# Patient Record
Sex: Female | Born: 1965
Health system: Southern US, Community
[De-identification: ages and names within clinical notes are randomized; demographics above are authoritative.]

## PROBLEM LIST (undated history)

## (undated) DIAGNOSIS — N028 Recurrent and persistent hematuria with other morphologic changes: Secondary | ICD-10-CM

## (undated) DIAGNOSIS — E785 Hyperlipidemia, unspecified: Secondary | ICD-10-CM

## (undated) DIAGNOSIS — I10 Essential (primary) hypertension: Secondary | ICD-10-CM

## (undated) HISTORY — DX: Recurrent and persistent hematuria with other morphologic changes: N02.8

## (undated) HISTORY — DX: Essential (primary) hypertension: I10

## (undated) HISTORY — DX: Hyperlipidemia, unspecified: E78.5

---

## 1998-04-18 ENCOUNTER — Inpatient Hospital Stay (HOSPITAL_COMMUNITY): Admission: AD | Admit: 1998-04-18 | Discharge: 1998-04-20 | Payer: Self-pay | Admitting: *Deleted

## 1998-05-24 ENCOUNTER — Other Ambulatory Visit: Admission: RE | Admit: 1998-05-24 | Discharge: 1998-05-24 | Payer: Self-pay | Admitting: *Deleted

## 1998-10-18 ENCOUNTER — Observation Stay (HOSPITAL_COMMUNITY): Admission: AD | Admit: 1998-10-18 | Discharge: 1998-10-19 | Payer: Self-pay | Admitting: Nephrology

## 1998-10-18 ENCOUNTER — Encounter: Payer: Self-pay | Admitting: Nephrology

## 1998-10-18 DIAGNOSIS — N02B9 Other recurrent and persistent immunoglobulin A nephropathy: Secondary | ICD-10-CM

## 1998-10-18 DIAGNOSIS — N028 Recurrent and persistent hematuria with other morphologic changes: Secondary | ICD-10-CM

## 1998-10-18 HISTORY — DX: Recurrent and persistent hematuria with other morphologic changes: N02.8

## 1998-10-18 HISTORY — DX: Other recurrent and persistent immunoglobulin A nephropathy: N02.B9

## 2000-01-19 ENCOUNTER — Other Ambulatory Visit: Admission: RE | Admit: 2000-01-19 | Discharge: 2000-01-19 | Payer: Self-pay | Admitting: Obstetrics & Gynecology

## 2000-08-05 ENCOUNTER — Inpatient Hospital Stay (HOSPITAL_COMMUNITY): Admission: AD | Admit: 2000-08-05 | Discharge: 2000-08-07 | Payer: Self-pay | Admitting: *Deleted

## 2000-09-22 ENCOUNTER — Other Ambulatory Visit: Admission: RE | Admit: 2000-09-22 | Discharge: 2000-09-22 | Payer: Self-pay | Admitting: Obstetrics and Gynecology

## 2011-09-20 ENCOUNTER — Ambulatory Visit (INDEPENDENT_AMBULATORY_CARE_PROVIDER_SITE_OTHER): Payer: BC Managed Care – PPO | Admitting: Family Medicine

## 2011-09-20 ENCOUNTER — Ambulatory Visit: Payer: BC Managed Care – PPO

## 2011-09-20 VITALS — BP 130/88 | HR 80 | Temp 98.4°F | Resp 16 | Ht 62.0 in | Wt 127.8 lb

## 2011-09-20 DIAGNOSIS — R059 Cough, unspecified: Secondary | ICD-10-CM

## 2011-09-20 DIAGNOSIS — R05 Cough: Secondary | ICD-10-CM

## 2011-09-20 DIAGNOSIS — Z0000 Encounter for general adult medical examination without abnormal findings: Secondary | ICD-10-CM

## 2011-09-20 DIAGNOSIS — J45901 Unspecified asthma with (acute) exacerbation: Secondary | ICD-10-CM | POA: Insufficient documentation

## 2011-09-20 MED ORDER — PREDNISONE 20 MG PO TABS: 40.0000 mg | ORAL_TABLET | Freq: Every day | ORAL | Status: AC

## 2011-09-20 MED ORDER — MOMETASONE FURO-FORMOTEROL FUM 200-5 MCG/ACT IN AERO: 2.0000 | INHALATION_SPRAY | Freq: Two times a day (BID) | RESPIRATORY_TRACT | Status: AC

## 2011-09-20 NOTE — Patient Instructions (Signed)
Follow-up in two weeks

## 2011-09-20 NOTE — Progress Notes (Addendum)
This is a 46 year old [place] woman who comes in with a three-month history of cough. Cough is intermittent but occurs almost every day and sometimes violent with productive sputum or hemoptysis. Appetite is good she's had no weight loss. There is no history of tuberculosis. There is no family history of tuberculosis.  Objective: This is a [place] woman with a language barrier who was seen with her husband she is in no acute respiratory distress  She does have about. There is no neck adenopathy heart is regular there is no murmur. And HEENT is normal. Skin is dry and warm. She is neurologically intact. [facility] reading (PRIMARY) by  Dr. Milus Glazier:  No infiltrate.  hyperinflated  Assessment: Asthma, chronic persistent

## 2011-10-14 ENCOUNTER — Encounter: Payer: Self-pay | Admitting: Nephrology

## 2011-10-14 DIAGNOSIS — E785 Hyperlipidemia, unspecified: Secondary | ICD-10-CM

## 2011-10-14 DIAGNOSIS — I10 Essential (primary) hypertension: Secondary | ICD-10-CM

## 2011-10-14 HISTORY — DX: Essential (primary) hypertension: I10

## 2011-10-14 HISTORY — DX: Hyperlipidemia, unspecified: E78.5

## 2011-10-25 ENCOUNTER — Ambulatory Visit (INDEPENDENT_AMBULATORY_CARE_PROVIDER_SITE_OTHER): Payer: BC Managed Care – PPO | Admitting: Family Medicine

## 2011-10-25 DIAGNOSIS — J45902 Unspecified asthma with status asthmaticus: Secondary | ICD-10-CM

## 2011-10-25 MED ORDER — PREDNISONE 20 MG PO TABS: ORAL_TABLET | ORAL | Status: DC

## 2011-10-25 NOTE — Progress Notes (Signed)
46 year old Lorraine Hernandez comes in with asthma. She is accompanied by her husband. She did well with the Julliard and prednisone but the symptoms have come back once she finished the prednisone. She has cough and scratchy throat. Just has some congestion.  Objective: HEENT-unremarkable except for mildly injected eyes  Chest: Few faint wheezes  Heart: Regular  Assessment: Persistent asthma  Plan: Continue dulera 200 bid, prednisone taper

## 2013-01-30 IMAGING — CR DG CHEST 2V
2 series · 2 of 2 positions shown · non-contrast
Comparison: None.

CLINICAL DATA: Cough.

CHEST - 2 VIEW

[PA]
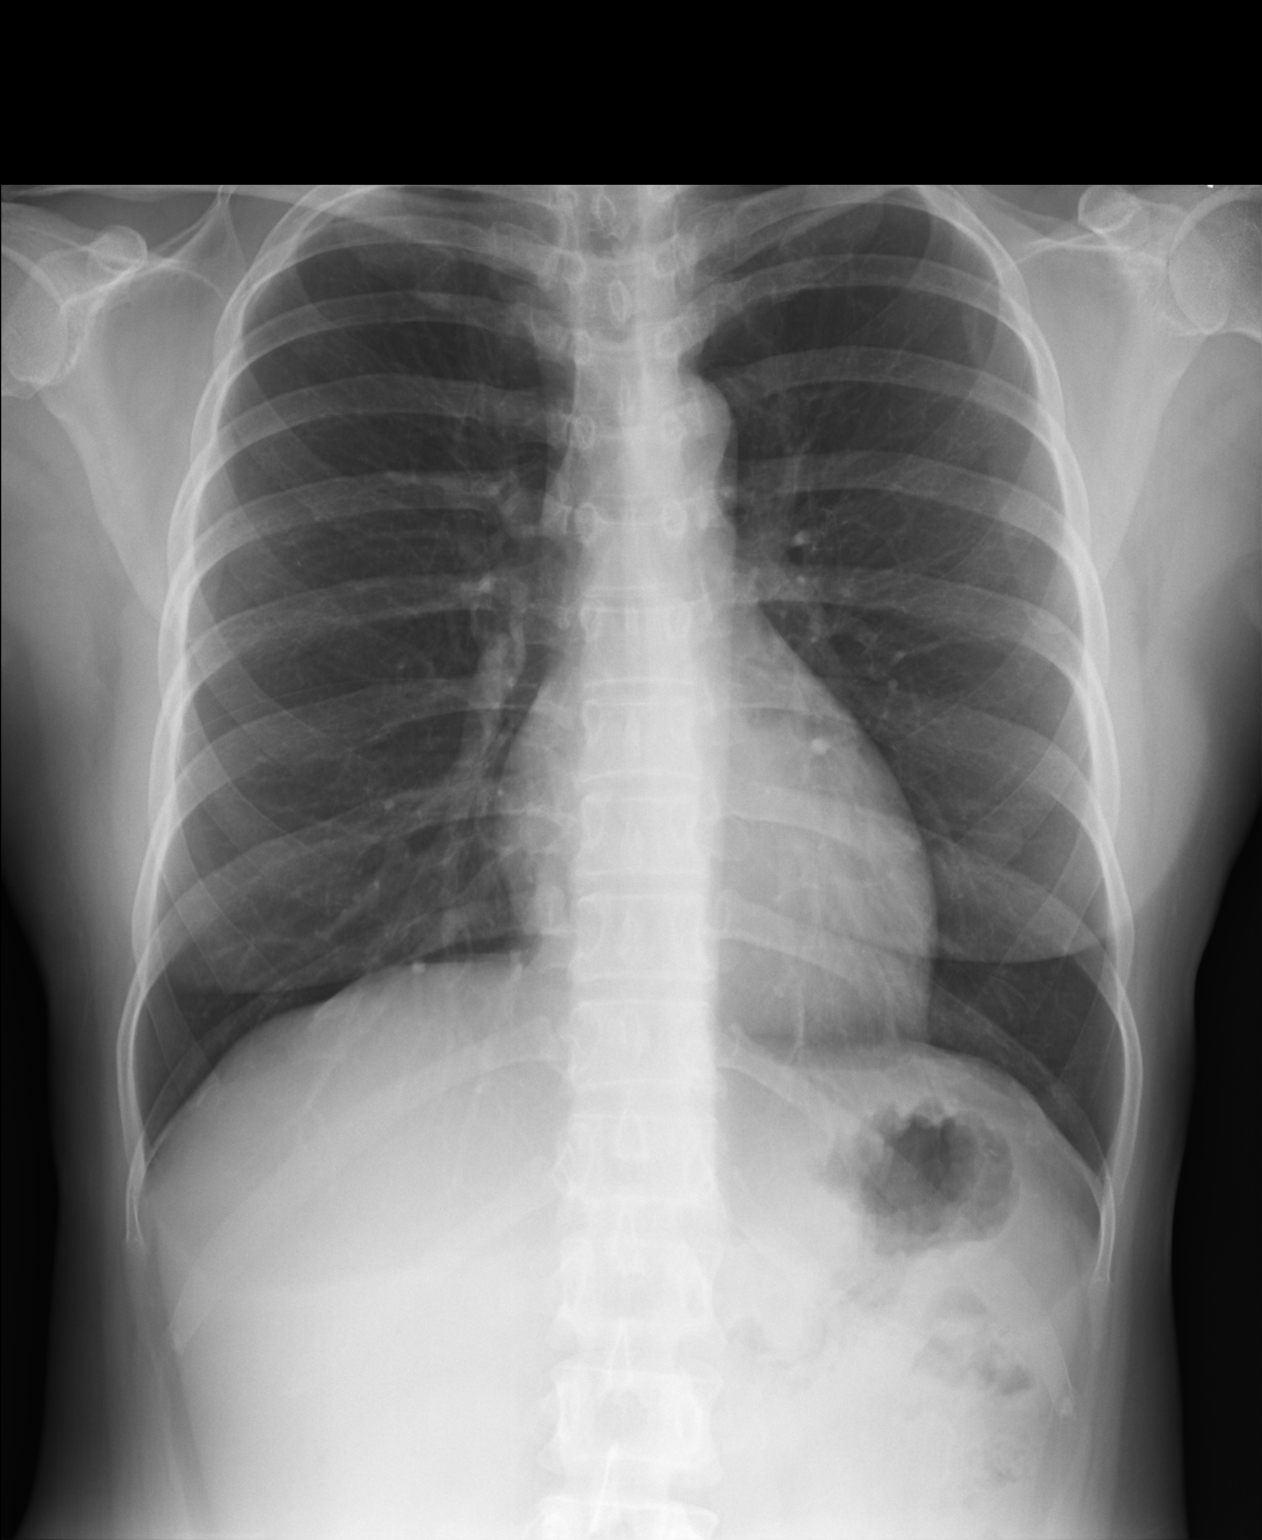

[lateral]
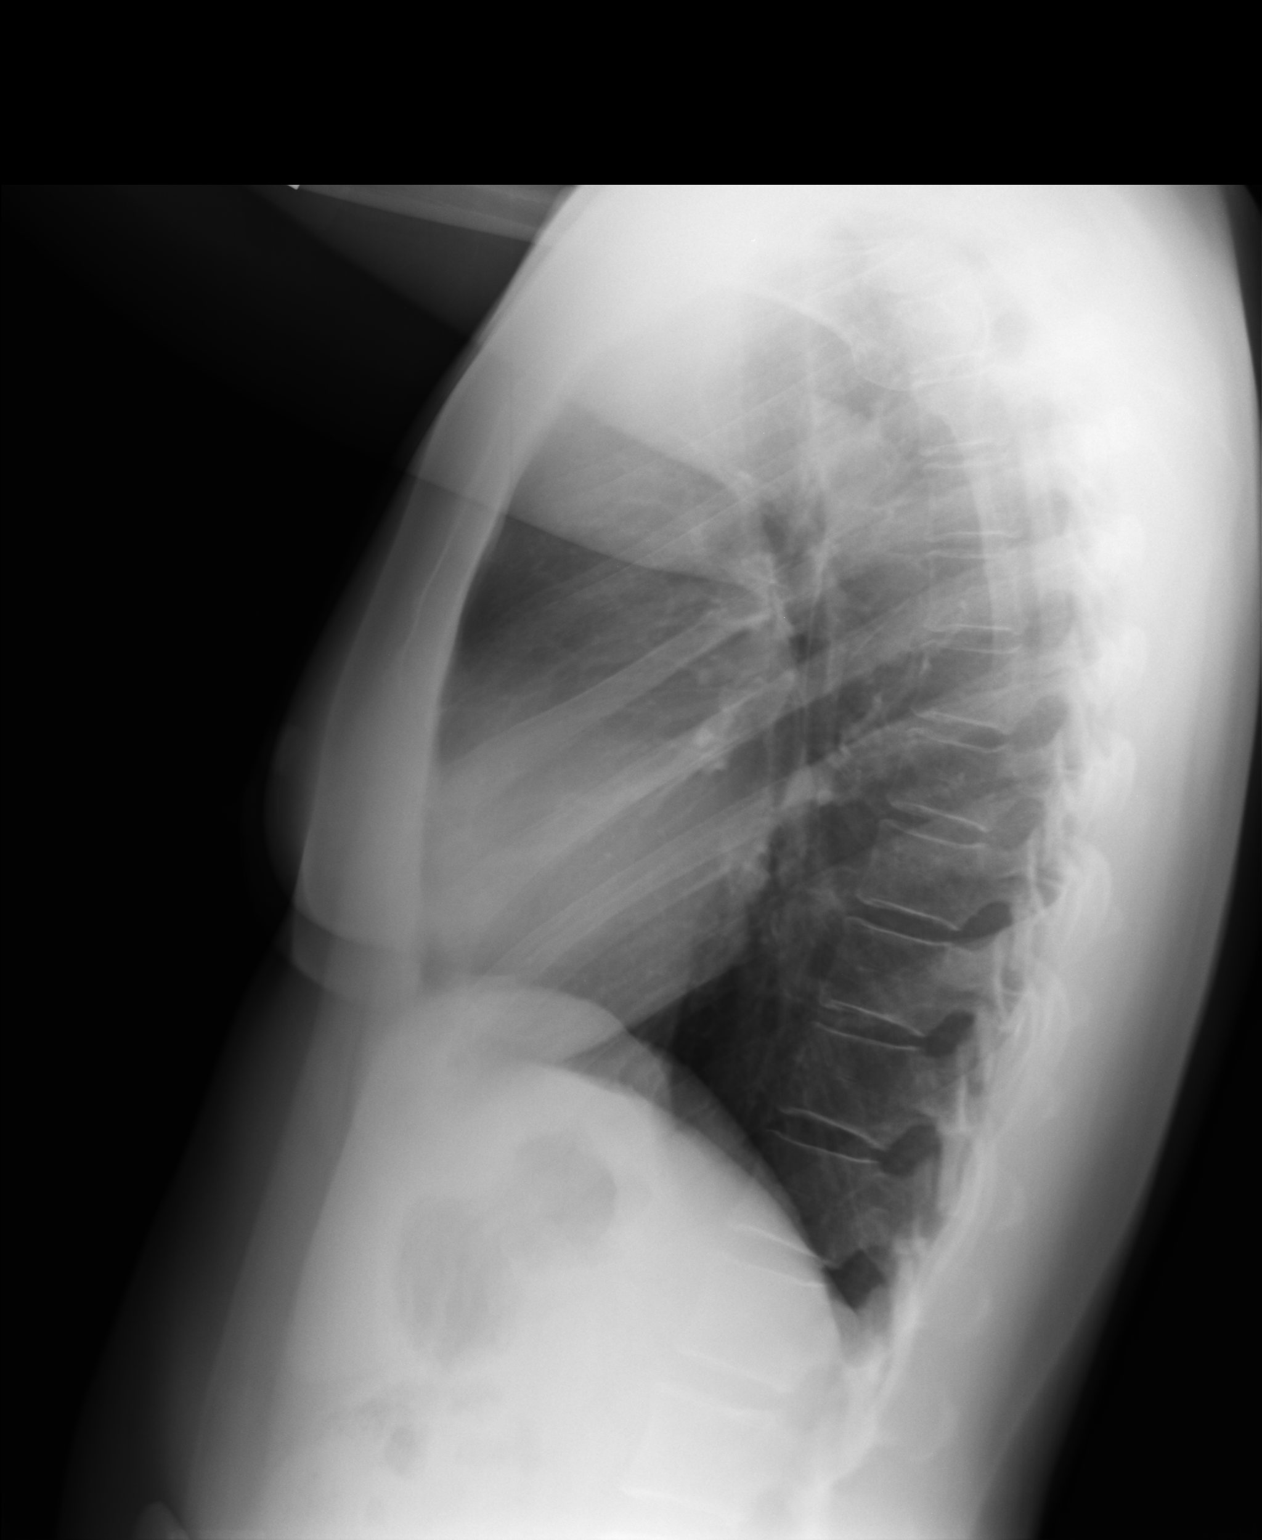

[2 of 2 positions shown; findings below may reference images not displayed]

FINDINGS: Cardiopericardial silhouette within normal limits.
Mediastinal contours normal. Trachea midline.  No airspace disease
or effusion.
IMPRESSION: No active cardiopulmonary disease.

## 2018-09-18 DIAGNOSIS — J40 Bronchitis, not specified as acute or chronic: Secondary | ICD-10-CM | POA: Diagnosis not present

## 2018-09-18 DIAGNOSIS — J0140 Acute pansinusitis, unspecified: Secondary | ICD-10-CM | POA: Diagnosis not present

## 2019-07-19 DIAGNOSIS — I10 Essential (primary) hypertension: Secondary | ICD-10-CM | POA: Diagnosis not present

## 2019-07-19 DIAGNOSIS — N028 Recurrent and persistent hematuria with other morphologic changes: Secondary | ICD-10-CM | POA: Diagnosis not present

## 2019-07-19 DIAGNOSIS — E785 Hyperlipidemia, unspecified: Secondary | ICD-10-CM | POA: Diagnosis not present

## 2019-07-28 DIAGNOSIS — E785 Hyperlipidemia, unspecified: Secondary | ICD-10-CM | POA: Diagnosis not present

## 2019-07-28 DIAGNOSIS — J309 Allergic rhinitis, unspecified: Secondary | ICD-10-CM | POA: Diagnosis not present

## 2019-07-28 DIAGNOSIS — N028 Recurrent and persistent hematuria with other morphologic changes: Secondary | ICD-10-CM | POA: Diagnosis not present

## 2019-07-28 DIAGNOSIS — I10 Essential (primary) hypertension: Secondary | ICD-10-CM | POA: Diagnosis not present

## 2019-07-31 DIAGNOSIS — Z20828 Contact with and (suspected) exposure to other viral communicable diseases: Secondary | ICD-10-CM | POA: Diagnosis not present

## 2019-08-19 DIAGNOSIS — Z03818 Encounter for observation for suspected exposure to other biological agents ruled out: Secondary | ICD-10-CM | POA: Diagnosis not present

## 2019-08-19 DIAGNOSIS — I10 Essential (primary) hypertension: Secondary | ICD-10-CM | POA: Diagnosis not present

## 2019-08-19 DIAGNOSIS — Z20828 Contact with and (suspected) exposure to other viral communicable diseases: Secondary | ICD-10-CM | POA: Diagnosis not present

## 2019-11-13 ENCOUNTER — Ambulatory Visit (INDEPENDENT_AMBULATORY_CARE_PROVIDER_SITE_OTHER): Payer: BC Managed Care – PPO | Admitting: Registered Nurse

## 2019-11-13 ENCOUNTER — Other Ambulatory Visit: Payer: Self-pay

## 2019-11-13 ENCOUNTER — Encounter: Payer: Self-pay | Admitting: Registered Nurse

## 2019-11-13 VITALS — BP 175/105 | HR 79 | Temp 97.9°F | Resp 18 | Ht 61.0 in | Wt 129.2 lb

## 2019-11-13 DIAGNOSIS — Z130 Encounter for screening for diseases of the blood and blood-forming organs and certain disorders involving the immune mechanism: Secondary | ICD-10-CM | POA: Diagnosis not present

## 2019-11-13 DIAGNOSIS — Z114 Encounter for screening for human immunodeficiency virus [HIV]: Secondary | ICD-10-CM | POA: Diagnosis not present

## 2019-11-13 DIAGNOSIS — Z1329 Encounter for screening for other suspected endocrine disorder: Secondary | ICD-10-CM

## 2019-11-13 DIAGNOSIS — Z23 Encounter for immunization: Secondary | ICD-10-CM

## 2019-11-13 DIAGNOSIS — Z13228 Encounter for screening for other metabolic disorders: Secondary | ICD-10-CM

## 2019-11-13 DIAGNOSIS — Z13220 Encounter for screening for lipoid disorders: Secondary | ICD-10-CM

## 2019-11-13 DIAGNOSIS — I10 Essential (primary) hypertension: Secondary | ICD-10-CM | POA: Diagnosis not present

## 2019-11-13 DIAGNOSIS — M549 Dorsalgia, unspecified: Secondary | ICD-10-CM | POA: Diagnosis not present

## 2019-11-13 DIAGNOSIS — Z7689 Persons encountering health services in other specified circumstances: Secondary | ICD-10-CM

## 2019-11-13 DIAGNOSIS — Z0000 Encounter for general adult medical examination without abnormal findings: Secondary | ICD-10-CM

## 2019-11-13 DIAGNOSIS — Z0001 Encounter for general adult medical examination with abnormal findings: Secondary | ICD-10-CM | POA: Diagnosis not present

## 2019-11-13 DIAGNOSIS — J302 Other seasonal allergic rhinitis: Secondary | ICD-10-CM

## 2019-11-13 LAB — POCT URINALYSIS DIP (CLINITEK)
Bilirubin, UA: NEGATIVE
Blood, UA: NEGATIVE
Glucose, UA: NEGATIVE mg/dL
Ketones, POC UA: NEGATIVE mg/dL
Leukocytes, UA: NEGATIVE
Nitrite, UA: NEGATIVE
POC PROTEIN,UA: NEGATIVE
Spec Grav, UA: 1.025 (ref 1.010–1.025)
Urobilinogen, UA: 0.2 E.U./dL
pH, UA: 6.5 (ref 5.0–8.0)

## 2019-11-13 MED ORDER — LOSARTAN POTASSIUM 50 MG PO TABS: 50.0000 mg | ORAL_TABLET | Freq: Every day | ORAL | 0 refills | Status: DC

## 2019-11-13 MED ORDER — MONTELUKAST SODIUM 10 MG PO TABS: 10.0000 mg | ORAL_TABLET | Freq: Every day | ORAL | 3 refills | Status: DC

## 2019-11-13 NOTE — Progress Notes (Signed)
New Patient Office Visit  Subjective:  Patient ID: Lorraine Hernandez, female    DOB: 08-03-1966  Age: 54 y.o. MRN: [id number]  CC:  Chief Complaint  Patient presents with  . New Patient (Initial Visit)    establish care and also get a physical . patient has had kidney problems and also hypertension    HPI Lorraine Hernandez presents for visit to establish care, CPE, htn, and hx of renal dysfunction.  HTN: taking enalapril 20mg  PO qd. No concerns. Has done well on this medication. However, bp elevated to 175/105 at arrival today. Will consider adding agent  Renal dysfunction: 20 years ago she had work that said her kidneys were not functioning appropriately, had a procedure, was suggested to follow up annually. No notable problems since. Feels well. No hematuria, frequency, urgency, flank pain, suprapubic pain.  C/o seasonal allergy symptoms today - happens every spring. Takes zicam otc, but still has itchy eyes and ears, sinus pressure, rhinorrhea. Interested in doing more. Symptoms worst at night.  Otherwise, due for Tdap and colonoscopy. Will place orders today   Past Medical History:  Diagnosis Date  . Hyperlipidemia 10/14/2011  . Hypertension 10/14/2011  . IgA nephropathy 10/18/1998   Renal biopsy 10/1998 Lorraine Hernandez)    History reviewed. No pertinent surgical history.  Family History  Family history unknown: Yes    Social History   Socioeconomic History  . Marital status: Married    Spouse name: Not on file  . Number of children: Not on file  . Years of education: Not on file  . Highest education level: Not on file  Occupational History  . Not on file  Tobacco Use  . Smoking status: Never Smoker  . Smokeless tobacco: Never Used  Substance and Sexual Activity  . Alcohol use: Never  . Drug use: Never  . Sexual activity: Yes  Other Topics Concern  . Not on file  Social History Narrative  . Not on file   Social Determinants of Health   Financial Resource Strain:   .  Difficulty of Paying Living Expenses:   Food Insecurity:   . Worried About Charity fundraiser in the Last Year:   . Arboriculturist in the Last Year:   Transportation Needs:   . Film/video editor (Medical):   Marland Kitchen Lack of Transportation (Non-Medical):   Physical Activity:   . Days of Exercise per Week:   . Minutes of Exercise per Session:   Stress:   . Feeling of Stress :   Social Connections:   . Frequency of Communication with Friends and Family:   . Frequency of Social Gatherings with Friends and Family:   . Attends Religious Services:   . Active Member of Clubs or Organizations:   . Attends Archivist Meetings:   Marland Kitchen Marital Status:   Intimate Partner Violence:   . Fear of Current or Ex-Partner:   . Emotionally Abused:   Marland Kitchen Physically Abused:   . Sexually Abused:     ROS Review of Systems  Constitutional: Negative.   HENT: Positive for rhinorrhea, sinus pressure and sneezing. Negative for congestion, dental problem, drooling, ear discharge, ear pain, facial swelling, hearing loss, mouth sores, nosebleeds, postnasal drip, sinus pain, sore throat, tinnitus, trouble swallowing and voice change.   Eyes: Positive for itching. Negative for photophobia, pain, discharge, redness and visual disturbance.  Respiratory: Negative.   Cardiovascular: Negative.   Gastrointestinal: Negative.   Endocrine: Negative.   Genitourinary: Negative.  Musculoskeletal: Negative.   Skin: Negative.   Allergic/Immunologic: Negative.   Neurological: Negative.   Hematological: Negative.   Psychiatric/Behavioral: Negative.   All other systems reviewed and are negative.   Objective:   Today's Vitals: BP (!) 175/105   Pulse 79   Temp 97.9 F (36.6 C) (Temporal)   Resp 18   Ht 5\' 1"  (1.549 m)   Wt 129 lb 3.2 oz (58.6 kg)   SpO2 93%   BMI 24.41 kg/m   Physical Exam Vitals and nursing note reviewed.  Constitutional:      General: She is not in acute distress.    Appearance:  Normal appearance. She is normal weight. She is not ill-appearing, toxic-appearing or diaphoretic.  HENT:     Head: Normocephalic and atraumatic.     Right Ear: Tympanic membrane, ear canal and external ear normal. There is no impacted cerumen.     Left Ear: Tympanic membrane, ear canal and external ear normal. There is no impacted cerumen.     Nose: Nose normal. No congestion or rhinorrhea.     Mouth/Throat:     Mouth: Mucous membranes are moist.     Pharynx: Oropharynx is clear. No oropharyngeal exudate or posterior oropharyngeal erythema.  Eyes:     General: No scleral icterus.       Right eye: No discharge.        Left eye: No discharge.     Extraocular Movements: Extraocular movements intact.     Conjunctiva/sclera: Conjunctivae normal.     Pupils: Pupils are equal, round, and reactive to light.  Cardiovascular:     Rate and Rhythm: Normal rate and regular rhythm.     Pulses: Normal pulses.     Heart sounds: Normal heart sounds. No murmur. No friction rub. No gallop.   Pulmonary:     Effort: Pulmonary effort is normal. No respiratory distress.     Breath sounds: Normal breath sounds. No stridor. No wheezing, rhonchi or rales.  Chest:     Chest wall: No tenderness.  Abdominal:     General: Abdomen is flat. Bowel sounds are normal. There is no distension.     Palpations: Abdomen is soft. There is no mass.     Tenderness: There is no abdominal tenderness. There is no right CVA tenderness, left CVA tenderness, guarding or rebound.     Hernia: No hernia is present.  Musculoskeletal:        General: No swelling, tenderness, deformity or signs of injury. Normal range of motion.     Right lower leg: No edema.     Left lower leg: No edema.  Skin:    General: Skin is warm and dry.     Capillary Refill: Capillary refill takes less than 2 seconds.     Coloration: Skin is not jaundiced or pale.     Findings: No bruising, erythema, lesion or rash.  Neurological:     General: No focal  deficit present.     Mental Status: She is alert and oriented to person, place, and time. Mental status is at baseline.     Cranial Nerves: No cranial nerve deficit.     Sensory: No sensory deficit.     Motor: No weakness.     Coordination: Coordination normal.     Gait: Gait normal.     Deep Tendon Reflexes: Reflexes normal.  Psychiatric:        Mood and Affect: Mood normal.        Behavior: Behavior  normal.        Thought Content: Thought content normal.        Judgment: Judgment normal.     Assessment & Plan:   Problem List Items Addressed This Visit      Cardiovascular and Mediastinum   Hypertension - Primary (Chronic)   Relevant Medications   losartan (COZAAR) 50 MG tablet    Other Visit Diagnoses    Back pain, unspecified back location, unspecified back pain laterality, unspecified chronicity       Relevant Orders   Ambulatory referral to Gastroenterology   Screening for endocrine, metabolic and immunity disorder       Relevant Orders   TSH   Hemoglobin A1c   Comprehensive metabolic panel   CBC with Differential   POCT URINALYSIS DIP (CLINITEK) (Completed)   Lipid screening       Relevant Orders   Lipid panel   Encounter for screening for HIV       Relevant Orders   HIV antibody (with reflex)   Seasonal allergies       Relevant Medications   montelukast (SINGULAIR) 10 MG tablet   Need for diphtheria-tetanus-pertussis (Tdap) vaccine       Relevant Orders   Tdap vaccine greater than or equal to 7yo IM   Encounter to establish care       Annual physical exam          Outpatient Encounter Medications as of 11/13/2019  Medication Sig  . losartan (COZAAR) 50 MG tablet Take 1 tablet (50 mg total) by mouth daily.  . Mometasone Furo-Formoterol Fum 200-5 MCG/ACT AERO Inhale 2 puffs into the lungs 2 (two) times daily. (Patient not taking: Reported on 11/13/2019)  . montelukast (SINGULAIR) 10 MG tablet Take 1 tablet (10 mg total) by mouth at bedtime.  . predniSONE  (DELTASONE) 20 MG tablet 2 daily x 5, then 1 daily x 5 with food (Patient not taking: Reported on 11/13/2019)   No facility-administered encounter medications on file as of 11/13/2019.    Follow-up: Return in about 2 weeks (around 11/27/2019) for bp check nurse visit.   PLAN  Exam unremarkable  Labs drawn, will follow up as warranted  Return in 2 weeks for bp check  Start losartan 50mg  po qd for htn  Continue enalapril 20mg  Po qd  Montelukast 10mg  PO qhs for seasonal allergies  tdap today  Pt will receive first dose of covid vaccine shortly  Otherwise, pt feeling well and without concern  Patient encouraged to call clinic with any questions, comments, or concerns.  , NP

## 2019-11-13 NOTE — Patient Instructions (Signed)
° ° ° °  If you have lab work done today you will be contacted with your lab results within the next 2 weeks.  If you have not heard from us then please contact us. The fastest way to get your results is to register for My Chart. ° ° °IF you received an x-ray today, you will receive an invoice from [facility]. Please contact [facility] at [phone] with questions or concerns regarding your invoice.  ° °IF you received labwork today, you will receive an invoice from [facility]. Please contact [facility] at [phone] with questions or concerns regarding your invoice.  ° °Our billing staff will not be able to assist you with questions regarding bills from these companies. ° °You will be contacted with the lab results as soon as they are available. The fastest way to get your results is to activate your My Chart account. Instructions are located on the last page of this paperwork. If you have not heard from us regarding the results in 2 weeks, please contact this office. °  ° ° ° °

## 2019-11-14 ENCOUNTER — Telehealth: Payer: Self-pay | Admitting: Registered Nurse

## 2019-11-14 LAB — COMPREHENSIVE METABOLIC PANEL
ALT: 14 IU/L (ref 0–32)
AST: 21 IU/L (ref 0–40)
Albumin/Globulin Ratio: 1.4 (ref 1.2–2.2)
Albumin: 4.2 g/dL (ref 3.8–4.9)
Alkaline Phosphatase: 77 IU/L (ref 39–117)
BUN/Creatinine Ratio: 19 (ref 9–23)
BUN: 14 mg/dL (ref 6–24)
Bilirubin Total: 0.2 mg/dL (ref 0.0–1.2)
CO2: 23 mmol/L (ref 20–29)
Calcium: 9.5 mg/dL (ref 8.7–10.2)
Chloride: 106 mmol/L (ref 96–106)
Creatinine, Ser: 0.74 mg/dL (ref 0.57–1.00)
GFR calc Af Amer: 108 mL/min/{1.73_m2} (ref >59)
GFR calc non Af Amer: 94 mL/min/{1.73_m2} (ref >59)
Globulin, Total: 2.9 g/dL (ref 1.5–4.5)
Glucose: 93 mg/dL (ref 65–99)
Potassium: 3.8 mmol/L (ref 3.5–5.2)
Sodium: 141 mmol/L (ref 134–144)
Total Protein: 7.1 g/dL (ref 6.0–8.5)

## 2019-11-14 LAB — LIPID PANEL
Chol/HDL Ratio: 3.4 ratio (ref 0.0–4.4)
Cholesterol, Total: 192 mg/dL (ref 100–199)
HDL: 56 mg/dL (ref >39)
LDL Chol Calc (NIH): 121 mg/dL — ABNORMAL HIGH (ref 0–99)
Triglycerides: 81 mg/dL (ref 0–149)
VLDL Cholesterol Cal: 15 mg/dL (ref 5–40)

## 2019-11-14 LAB — CBC WITH DIFFERENTIAL/PLATELET
Basophils Absolute: 0 10*3/uL (ref 0.0–0.2)
Basos: 1 %
EOS (ABSOLUTE): 1.1 10*3/uL — ABNORMAL HIGH (ref 0.0–0.4)
Eos: 13 %
Hematocrit: 42.6 % (ref 34.0–46.6)
Hemoglobin: 14.2 g/dL (ref 11.1–15.9)
Immature Grans (Abs): 0 10*3/uL (ref 0.0–0.1)
Immature Granulocytes: 0 %
Lymphocytes Absolute: 3 10*3/uL (ref 0.7–3.1)
Lymphs: 37 %
MCH: 29.6 pg (ref 26.6–33.0)
MCHC: 33.3 g/dL (ref 31.5–35.7)
MCV: 89 fL (ref 79–97)
Monocytes Absolute: 0.5 10*3/uL (ref 0.1–0.9)
Monocytes: 7 %
Neutrophils Absolute: 3.5 10*3/uL (ref 1.4–7.0)
Neutrophils: 42 %
Platelets: 299 10*3/uL (ref 150–450)
RBC: 4.79 x10E6/uL (ref 3.77–5.28)
RDW: 13 % (ref 11.7–15.4)
WBC: 8.1 10*3/uL (ref 3.4–10.8)

## 2019-11-14 LAB — HEMOGLOBIN A1C
Est. average glucose Bld gHb Est-mCnc: 114 mg/dL
Hgb A1c MFr Bld: 5.6 % (ref 4.8–5.6)

## 2019-11-14 LAB — HIV ANTIBODY (ROUTINE TESTING W REFLEX): HIV Screen 4th Generation wRfx: NONREACTIVE

## 2019-11-14 LAB — TSH: TSH: 0.528 u[IU]/mL (ref 0.450–4.500)

## 2019-11-14 NOTE — Telephone Encounter (Signed)
The referral to GI is for back pain, and they don't handle that.  A new referral will have to be sent.

## 2019-11-21 ENCOUNTER — Encounter: Payer: Self-pay | Admitting: Radiology

## 2019-11-21 NOTE — Progress Notes (Signed)
Good afternoon,  Normal results letter, please!  Thank you,  [name], NP

## 2019-11-27 ENCOUNTER — Ambulatory Visit: Payer: BC Managed Care – PPO

## 2019-12-18 ENCOUNTER — Ambulatory Visit: Payer: BC Managed Care – PPO | Admitting: Registered Nurse

## 2019-12-18 ENCOUNTER — Other Ambulatory Visit: Payer: Self-pay

## 2019-12-18 VITALS — BP 141/82

## 2019-12-18 DIAGNOSIS — I10 Essential (primary) hypertension: Secondary | ICD-10-CM

## 2020-02-09 ENCOUNTER — Other Ambulatory Visit: Payer: Self-pay | Admitting: Registered Nurse

## 2020-02-09 DIAGNOSIS — I10 Essential (primary) hypertension: Secondary | ICD-10-CM

## 2020-03-09 ENCOUNTER — Other Ambulatory Visit: Payer: Self-pay | Admitting: Registered Nurse

## 2020-03-09 DIAGNOSIS — J302 Other seasonal allergic rhinitis: Secondary | ICD-10-CM

## 2020-05-10 ENCOUNTER — Other Ambulatory Visit: Payer: Self-pay | Admitting: Registered Nurse

## 2020-05-10 DIAGNOSIS — I10 Essential (primary) hypertension: Secondary | ICD-10-CM

## 2020-07-17 DIAGNOSIS — E785 Hyperlipidemia, unspecified: Secondary | ICD-10-CM | POA: Diagnosis not present

## 2020-07-17 DIAGNOSIS — I10 Essential (primary) hypertension: Secondary | ICD-10-CM | POA: Diagnosis not present

## 2020-07-17 DIAGNOSIS — N028 Recurrent and persistent hematuria with other morphologic changes: Secondary | ICD-10-CM | POA: Diagnosis not present

## 2020-08-11 ENCOUNTER — Other Ambulatory Visit: Payer: Self-pay | Admitting: Registered Nurse

## 2020-08-11 DIAGNOSIS — I10 Essential (primary) hypertension: Secondary | ICD-10-CM

## 2020-08-11 NOTE — Telephone Encounter (Signed)
Requested medication (s) are due for refill today: yes  Requested medication (s) are on the active medication list: yes  Last refill:  05/10/20  Future visit scheduled: no  Notes to clinic:  needs appt   Requested Prescriptions  Pending Prescriptions Disp Refills   losartan (COZAAR) 50 MG tablet [Pharmacy Med Name: LOSARTAN 50MG  TABLETS] 90 tablet 0    Sig: TAKE 1 TABLET(50 MG) BY MOUTH DAILY      Cardiovascular:  Angiotensin Receptor Blockers Failed - 08/11/2020  3:43 AM      Failed - Cr in normal range and within 180 days    Creatinine, Ser  Date Value Ref Range Status  11/13/2019 0.74 0.57 - 1.00 mg/dL Final          Failed - K in normal range and within 180 days    Potassium  Date Value Ref Range Status  11/13/2019 3.8 3.5 - 5.2 mmol/L Final          Failed - Last BP in normal range    BP Readings from Last 1 Encounters:  12/18/19 (!) 141/82          Failed - Valid encounter within last 6 months    Recent Outpatient Visits           9 months ago Essential hypertension   [facility] at [facility], Shelbie Ammons, NP   8 years ago Asthma   [facility] at [facility], MD   8 years ago Cough   [facility] at [facility], MD                Passed - Patient is not pregnant

## 2020-09-19 ENCOUNTER — Other Ambulatory Visit: Payer: Self-pay | Admitting: Registered Nurse

## 2020-09-19 DIAGNOSIS — J302 Other seasonal allergic rhinitis: Secondary | ICD-10-CM

## 2020-11-06 ENCOUNTER — Other Ambulatory Visit: Payer: Self-pay | Admitting: Registered Nurse

## 2020-11-06 DIAGNOSIS — J302 Other seasonal allergic rhinitis: Secondary | ICD-10-CM

## 2020-11-13 DIAGNOSIS — L249 Irritant contact dermatitis, unspecified cause: Secondary | ICD-10-CM | POA: Diagnosis not present

## 2020-11-13 DIAGNOSIS — J309 Allergic rhinitis, unspecified: Secondary | ICD-10-CM | POA: Diagnosis not present

## 2021-02-03 DIAGNOSIS — N028 Recurrent and persistent hematuria with other morphologic changes: Secondary | ICD-10-CM | POA: Diagnosis not present

## 2021-02-03 DIAGNOSIS — I10 Essential (primary) hypertension: Secondary | ICD-10-CM | POA: Diagnosis not present

## 2022-01-29 DIAGNOSIS — E785 Hyperlipidemia, unspecified: Secondary | ICD-10-CM | POA: Diagnosis not present

## 2022-01-29 DIAGNOSIS — N028 Recurrent and persistent hematuria with other morphologic changes: Secondary | ICD-10-CM | POA: Diagnosis not present

## 2022-01-29 DIAGNOSIS — I10 Essential (primary) hypertension: Secondary | ICD-10-CM | POA: Diagnosis not present

## 2022-09-28 DIAGNOSIS — R0981 Nasal congestion: Secondary | ICD-10-CM | POA: Diagnosis not present

## 2022-09-28 DIAGNOSIS — M791 Myalgia, unspecified site: Secondary | ICD-10-CM | POA: Diagnosis not present

## 2022-09-28 DIAGNOSIS — J029 Acute pharyngitis, unspecified: Secondary | ICD-10-CM | POA: Diagnosis not present

## 2022-09-28 DIAGNOSIS — B349 Viral infection, unspecified: Secondary | ICD-10-CM | POA: Diagnosis not present

## 2023-09-01 ENCOUNTER — Ambulatory Visit
Admission: EM | Admit: 2023-09-01 | Discharge: 2023-09-01 | Disposition: A | Discharge status: Home / Self Care | Payer: Self-pay | Attending: Family Medicine | Admitting: Family Medicine

## 2023-09-01 DIAGNOSIS — R053 Chronic cough: Secondary | ICD-10-CM

## 2023-09-01 MED ORDER — ALBUTEROL SULFATE HFA 108 (90 BASE) MCG/ACT IN AERS: 2.0000 | INHALATION_SPRAY | Freq: Four times a day (QID) | RESPIRATORY_TRACT | 0 refills | Status: AC | PRN

## 2023-09-01 MED ORDER — PREDNISONE 20 MG PO TABS: 60.0000 mg | ORAL_TABLET | Freq: Every day | ORAL | 0 refills | Status: AC

## 2023-09-01 NOTE — ED Provider Notes (Signed)
 [facility]    CSN: [id number] Arrival date & time: 09/01/23  1522      History   Chief Complaint Chief Complaint  Patient presents with   Cough   Nasal Congestion    HPI Lorraine Hernandez is a 58 y.o. female.   The history is provided by the patient. The history is limited by a language barrier. A language interpreter was used.  Cough Associated symptoms: wheezing   Associated symptoms: no chest pain, no headaches, no rhinorrhea, no shortness of breath and no sore throat   Cough for 3 to 4 months, nonproductive.  Cough is worse at night and associated with wheezing. Denies fever, chills, sweats, chest pain, shortness of breath, recent travel, change in weight, hemoptysis, sore throat, ear pain, abdominal pain, nausea, vomiting, diarrhea denies household contacts with illness.  Denies change in environment.  Had similar symptoms in the past, states it was treated with a shot  Past Medical History:  Diagnosis Date   Hyperlipidemia 10/14/2011   Hypertension 10/14/2011   IgA nephropathy 10/18/1998   Renal biopsy 10/1998 [facility])    Patient Active Problem List   Diagnosis Date Noted   Hypertension 10/14/2011   Hyperlipidemia 10/14/2011   Routine health maintenance 09/20/2011   Asthma with acute exacerbation 09/20/2011   IgA nephropathy 10/18/1998    History reviewed. No pertinent surgical history.  OB History   No obstetric history on file.      Home Medications    Prior to Admission medications   Medication Sig Start Date End Date Taking? Authorizing Provider  losartan  (COZAAR ) 50 MG tablet TAKE 1 TABLET(50 MG) BY MOUTH DAILY 08/12/20   Ulyess Gammons, NP  Mometasone  Furo-Formoterol  Fum 200-5 MCG/ACT AERO Inhale 2 puffs into the lungs 2 (two) times daily. Patient not taking: Reported on 11/13/2019 09/20/11   Dain Drown, MD  montelukast  (SINGULAIR ) 10 MG tablet TAKE 1 TABLET(10 MG) BY MOUTH AT BEDTIME 09/19/20   Ulyess Gammons, NP  predniSONE  (DELTASONE ) 20  MG tablet 2 daily x 5, then 1 daily x 5 with food Patient not taking: Reported on 11/13/2019 10/25/11   Dain Drown, MD    Family History Family History  Family history unknown: Yes    Social History Social History   Tobacco Use   Smoking status: Never   Smokeless tobacco: Never  Vaping Use   Vaping status: Never Used  Substance Use Topics   Alcohol use: Never   Drug use: Never     Allergies   Patient has no known allergies.   Review of Systems Review of Systems  HENT:  Negative for rhinorrhea and sore throat.   Respiratory:  Positive for cough and wheezing. Negative for shortness of breath.   Cardiovascular:  Negative for chest pain, palpitations and leg swelling.  Gastrointestinal:  Negative for abdominal pain, diarrhea, nausea and vomiting.  Neurological:  Negative for headaches.     Physical Exam Triage Vital Signs ED Triage Vitals  Encounter Vitals Group     BP 09/01/23 1550 124/79     Systolic BP Percentile --      Diastolic BP Percentile --      Pulse Rate 09/01/23 1550 90     Resp 09/01/23 1550 18     Temp 09/01/23 1550 98 F (36.7 C)     Temp Source 09/01/23 1550 Oral     SpO2 09/01/23 1550 94 %     Weight 09/01/23 1547 138 lb (62.6 kg)     Height  09/01/23 1547 5\' 1"  (1.549 m)     Head Circumference --      Peak Flow --      Pain Score 09/01/23 1547 0     Pain Loc --      Pain Education --      Exclude from Growth Chart --    No data found.  Updated Vital Signs BP 124/79 (BP Location: Right Arm)   Pulse 90   Temp 98 F (36.7 C) (Oral)   Resp 18   Ht 5\' 1"  (1.549 m)   Wt 138 lb (62.6 kg)   SpO2 94%   BMI 26.07 kg/m   Visual Acuity Right Eye Distance:   Left Eye Distance:   Bilateral Distance:    Right Eye Near:   Left Eye Near:    Bilateral Near:     Physical Exam Vitals and nursing note reviewed.  Constitutional:      Appearance: Normal appearance.  HENT:     Head: Normocephalic and atraumatic.     Right Ear: Tympanic  membrane and ear canal normal.     Left Ear: Tympanic membrane and ear canal normal.     Nose: No rhinorrhea.     Mouth/Throat:     Mouth: Mucous membranes are moist.  Eyes:     Conjunctiva/sclera: Conjunctivae normal.  Cardiovascular:     Rate and Rhythm: Regular rhythm.     Heart sounds: Normal heart sounds.  Pulmonary:     Effort: Pulmonary effort is normal. No respiratory distress.     Breath sounds: Normal breath sounds. No wheezing, rhonchi or rales.  Musculoskeletal:     Cervical back: Neck supple.  Skin:    General: Skin is warm and dry.  Neurological:     Mental Status: She is alert and oriented to person, place, and time.  Psychiatric:        Mood and Affect: Mood normal.      UC Treatments / Results  Labs (all labs ordered are listed, but only abnormal results are displayed) Labs Reviewed - No data to display  EKG   Radiology No results found.  Procedures Procedures (including critical care time)  Medications Ordered in UC Medications - No data to display  Initial Impression / Assessment and Plan / UC Course  I have reviewed the triage vital signs and the nursing notes.  Pertinent labs & imaging results that were available during my care of the patient were reviewed by me and considered in my medical decision making (see chart for details).     58 year old female complaining of cough for several months, cough is dry, worse at night, interferes with sleep, associated with wheezing.  Denies smoking, history of asthma, recent travel, known contacts with similar symptoms.  She is well-appearing, afebrile, nontoxic, lungs are clear to auscultation.  We do not have x-ray available in our clinic today.  Will treat for reactive airway disease, patient was counseled to follow-up if her symptoms fail to improve as she may need a chest x-ray.  She has a PCP.  Return precautions reviewed with patient Final Clinical Impressions(s) / UC Diagnoses   Final diagnoses:   None   Discharge Instructions   None    ED Prescriptions   None    PDMP not reviewed this encounter.   Paiton B[place]tinghouse, [place] 09/01/23 1623

## 2023-09-01 NOTE — ED Notes (Addendum)
 Pt requesting interpreter for provider assessment. Pt did not require translator for triage questions. Chart updated to reflect needing interpreter in [place] language.

## 2023-09-01 NOTE — ED Triage Notes (Addendum)
 Pt presents with complaints of cough and nasal congestion x 10 days. Pt is also reporting shortness of breath at night when lying in bed. Pt denies taking medications for symptoms and denies pain at this time. Denies fevers at home.

## 2023-09-01 NOTE — Discharge Instructions (Signed)
 Follow-up with your doctor if your symptoms fail to improve
